# Patient Record
Sex: Female | Born: 1995 | Race: White | Hispanic: No | Marital: Single | Smoking: Never smoker
Health system: Southern US, Community
[De-identification: ages and names within clinical notes are randomized; demographics above are authoritative.]

---
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
## 2017-01-08 DIAGNOSIS — Z23 Encounter for immunization: Secondary | ICD-10-CM | POA: Diagnosis not present

## 2017-08-13 ENCOUNTER — Ambulatory Visit: Payer: Self-pay | Admitting: Nurse Practitioner

## 2017-08-19 ENCOUNTER — Encounter: Payer: Self-pay | Admitting: Nurse Practitioner

## 2017-08-19 ENCOUNTER — Ambulatory Visit (INDEPENDENT_AMBULATORY_CARE_PROVIDER_SITE_OTHER): Payer: BLUE CROSS/BLUE SHIELD | Admitting: Nurse Practitioner

## 2017-08-19 VITALS — BP 100/60 | HR 70 | Temp 98.3°F | Ht 63.0 in | Wt 123.0 lb

## 2017-08-19 DIAGNOSIS — Z136 Encounter for screening for cardiovascular disorders: Secondary | ICD-10-CM | POA: Diagnosis not present

## 2017-08-19 DIAGNOSIS — Z0000 Encounter for general adult medical examination without abnormal findings: Secondary | ICD-10-CM | POA: Diagnosis not present

## 2017-08-19 DIAGNOSIS — Z13220 Encounter for screening for lipoid disorders: Secondary | ICD-10-CM

## 2017-08-19 LAB — COMPREHENSIVE METABOLIC PANEL
ALT: 8 U/L (ref 0–35)
AST: 16 U/L (ref 0–37)
Albumin: 4.3 g/dL (ref 3.5–5.2)
Alkaline Phosphatase: 61 U/L (ref 39–117)
BUN: 8 mg/dL (ref 6–23)
CO2: 23 meq/L (ref 19–32)
Calcium: 9 mg/dL (ref 8.4–10.5)
Chloride: 105 mEq/L (ref 96–112)
Creatinine, Ser: 0.58 mg/dL (ref 0.40–1.20)
GFR: 139.64 mL/min (ref >60.00)
GLUCOSE: 85 mg/dL (ref 70–99)
Potassium: 3.8 mEq/L (ref 3.5–5.1)
Sodium: 136 mEq/L (ref 135–145)
Total Bilirubin: 0.6 mg/dL (ref 0.2–1.2)
Total Protein: 7.7 g/dL (ref 6.0–8.3)

## 2017-08-19 LAB — CBC
HCT: 33.2 % — ABNORMAL LOW (ref 36.0–46.0)
HEMOGLOBIN: 10.6 g/dL — AB (ref 12.0–15.0)
MCHC: 31.8 g/dL (ref 30.0–36.0)
MCV: 75.1 fl — ABNORMAL LOW (ref 78.0–100.0)
Platelets: 305 10*3/uL (ref 150.0–400.0)
RBC: 4.43 Mil/uL (ref 3.87–5.11)
RDW: 18.9 % — ABNORMAL HIGH (ref 11.5–14.6)
WBC: 5.6 10*3/uL (ref 4.5–10.5)

## 2017-08-19 LAB — LIPID PANEL
CHOL/HDL RATIO: 2
Cholesterol: 136 mg/dL (ref 0–200)
HDL: 58.2 mg/dL (ref >39.00)
LDL Cholesterol: 69 mg/dL (ref 0–99)
NONHDL: 77.87
Triglycerides: 42 mg/dL (ref 0.0–149.0)
VLDL: 8.4 mg/dL (ref 0.0–40.0)

## 2017-08-19 LAB — TSH: TSH: 2.61 u[IU]/mL (ref 0.35–5.50)

## 2017-08-19 NOTE — Progress Notes (Signed)
Subjective:    Patient ID: Allison Taylor, female    DOB: 10-20-1995, 22 y.o.   MRN: [id number]  Patient presents today for complete physical  HPI Also needs school health form completed. 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  Denies any acute complain.  Immunizations: (TDAP, Hep C screen, Pneumovax, Influenza, zoster)  Health Maintenance  Topic Date Due  . HIV Screening  08/20/2018*  . Flu Shot  09/09/2017  . Tetanus Vaccine  09/08/2021  *Topic was postponed. The date shown is not the original due date.   Diet:regular.  Weight:  Wt Readings from Last 3 Encounters:  08/19/17 123 lb (55.8 kg)  06/29/14 116 lb (52.6 kg) (34 %, Z= -0.40)*   * Growth percentiles are based on CDC (Girls, 2-20 Years) data.   Exercise:none.  Fall Risk: Fall Risk  08/19/2017  Falls in the past year? No   Home Safety:home with siblings and parents.  Depression/Suicide: Depression screen PHQ 2/9 08/19/2017  Decreased Interest 0  Down, Depressed, Hopeless 0  PHQ - 2 Score 0   Vision:  Visual Acuity Screening   Right eye Left eye Both eyes  Without correction:     With correction: 20/20 20/20 20/15    Dental:every 6months, braces inserted, up to date.  Medications and allergies reviewed with patient and updated if appropriate.  There are no active problems to display for this patient.   No current outpatient medications on file prior to visit.   No current facility-administered medications on file prior to visit.     History reviewed. No pertinent past medical history.  History reviewed. No pertinent surgical history.  Social History   Socioeconomic History  . Marital status: Single    Spouse name: Not on file  . Number of children: Not on file  . Years of education: Not on file  . Highest education level: Not on file  Occupational History  . Occupation: Consulting civil engineer    Comment: [employer]  Social Needs  . Financial resource strain: Not on file  . Food insecurity:   Worry: Not on file    Inability: Not on file  . Transportation needs:    Medical: Not on file    Non-medical: Not on file  Tobacco Use  . Smoking status: Never Smoker  . Smokeless tobacco: Never Used  Substance and Sexual Activity  . Alcohol use: Yes    Comment: social  . Drug use: Never  . Sexual activity: Not Currently  Lifestyle  . Physical activity:    Days per week: Not on file    Minutes per session: Not on file  . Stress: Not on file  Relationships  . Social connections:    Talks on phone: Not on file    Gets together: Not on file    Attends religious service: Not on file    Active member of club or organization: Not on file    Attends meetings of clubs or organizations: Not on file    Relationship status: Not on file  Other Topics Concern  . Not on file  Social History Narrative  . Not on file    Family History  Problem Relation Age of Onset  . Cancer Maternal Grandmother   . Anemia Mother   . Hypotension Mother        Review of Systems  Constitutional: Negative for fever, malaise/fatigue and weight loss.  HENT: Negative for congestion and sore throat.   Eyes:       Negative for  visual changes  Respiratory: Negative for cough and shortness of breath.   Cardiovascular: Negative for chest pain, palpitations and leg swelling.  Gastrointestinal: Negative for blood in stool, constipation, diarrhea and heartburn.  Genitourinary: Negative for dysuria, frequency and urgency.  Musculoskeletal: Negative for falls, joint pain and myalgias.  Skin: Negative for rash.  Neurological: Negative for dizziness, sensory change and headaches.  Endo/Heme/Allergies: Does not bruise/bleed easily.  Psychiatric/Behavioral: Negative for depression, substance abuse and suicidal ideas. The patient is not nervous/anxious.     Objective:   Vitals:   08/19/17 1006  BP: 100/60  Pulse: 70  Temp: 98.3 F (36.8 C)  SpO2: 99%    Body mass index is 21.79 kg/m.   Physical  Examination:  Physical Exam  Constitutional: She is oriented to person, place, and time. She appears well-developed. No distress.  HENT:  Right Ear: External ear normal.  Left Ear: External ear normal.  Nose: Nose normal.  Mouth/Throat: Oropharynx is clear and moist. No oropharyngeal exudate.  Eyes: Pupils are equal, round, and reactive to light. Conjunctivae and EOM are normal.  Neck: Normal range of motion. Neck supple. No thyromegaly present.  Cardiovascular: Normal rate, regular rhythm and normal heart sounds.  Pulmonary/Chest: Effort normal and breath sounds normal. No respiratory distress. She exhibits no tenderness.  Declined breast exam  Abdominal: Soft. Bowel sounds are normal. She exhibits no distension. There is no tenderness.  Genitourinary:  Genitourinary Comments: Deferred to next year.  Musculoskeletal: Normal range of motion. She exhibits no edema, tenderness or deformity.  Lymphadenopathy:    She has no cervical adenopathy.  Neurological: She is alert and oriented to person, place, and time. She has normal reflexes.  Skin: Skin is warm and dry. Capillary refill takes less than 2 seconds. No rash noted.  Psychiatric: She has a normal mood and affect. Her behavior is normal. Thought content normal.  Vitals reviewed.   ASSESSMENT and PLAN:  Valora CorporalJueun was seen today for establish care.  Diagnoses and all orders for this visit:  Preventative health care -     CBC -     TSH -     Lipid panel -     Comprehensive metabolic panel  Encounter for lipid screening for cardiovascular disease -     Lipid panel   No problem-specific Assessment & Plan notes found for this encounter.     Follow up: Return if symptoms worsen or fail to improve.  Alysia Pennaharlotte Tanairy Payeur, NP

## 2017-08-19 NOTE — Patient Instructions (Addendum)
Normal lipid panel, CMP and TSH. CBC indicates mild anemia, possibly due to iron deficiency. Start multivitamin with iron 1tab once a day. Also maintain balanced diet and adequate oral hydration  Due to low blood pressure, maintain adequate oral hydration at all times.  Hypotension As your heart beats, it forces blood through your body. This force is called blood pressure. If you have hypotension, you have low blood pressure. When your blood pressure is too low, you may not get enough blood to your brain. You may feel weak, feel light-headed, have a fast heartbeat, or even pass out (faint). Follow these instructions at home: Eating and drinking  Drink enough fluids to keep your pee (urine) clear or pale yellow.  Eat a healthy diet, and follow instructions from your doctor about eating or drinking restrictions. A healthy diet includes: ? Fresh fruits and vegetables. ? Whole grains. ? Low-fat (lean) meats. ? Low-fat dairy products.  Eat extra salt only as told. Do not add extra salt to your diet unless your doctor tells you to.  Eat small meals often.  Avoid standing up quickly after you eat. Medicines  Take over-the-counter and prescription medicines only as told by your doctor. ? Follow instructions from your doctor about changing how much you take (the dosage) of your medicines, if this applies. ? Do not stop or change your medicine on your own. General instructions  Wear compression stockings as told by your doctor.  Get up slowly from lying down or sitting.  Avoid hot showers and a lot of heat as told by your doctor.  Return to your normal activities as told by your doctor. Ask what activities are safe for you.  Do not use any products that contain nicotine or tobacco, such as cigarettes and e-cigarettes. If you need help quitting, ask your doctor.  Keep all follow-up visits as told by your doctor. This is important. Contact a doctor if:  You throw up (vomit).  You  have watery poop (diarrhea).  You have a fever for more than 2-3 days.  You feel more thirsty than normal.  You feel weak and tired. Get help right away if:  You have chest pain.  You have a fast or irregular heartbeat.  You lose feeling (get numbness) in any part of your body.  You cannot move your arms or your legs.  You have trouble talking.  You get sweaty or feel light-headed.  You faint.  You have trouble breathing.  You have trouble staying awake.  You feel confused. This information is not intended to replace advice given to you by your health care provider. Make sure you discuss any questions you have with your health care provider. Document Released: 04/22/2009 Document Revised: 10/15/2015 Document Reviewed: 10/15/2015 Elsevier Interactive Patient Education  2017 [place] Breast self-awareness means:  Knowing how your breasts look.  Knowing how your breasts feel.  Checking your breasts every month for changes.  Telling your doctor if you notice a change in your breasts.  Breast self-awareness allows you to notice a breast problem early while it is still small. How to do a breast self-exam One way to learn what is normal for your breasts and to check for changes is to do a breast self-exam. To do a breast self-exam: Look for Changes  1. Take off all the clothes above your waist. 2. Stand in front of a mirror in a room with good lighting. 3. Put your hands on your hips.  4. Push your hands down. 5. Look at your breasts and nipples in the mirror to see if one breast or nipple looks different than the other. Check to see if: ? The shape of one breast is different. ? The size of one breast is different. ? There are wrinkles, dips, and bumps in one breast and not the other. 6. Look at each breast for changes in your skin, such as: ? Redness. ? Scaly areas. 7. Look for changes in your nipples, such as: ? Liquid around the  nipples. ? Bleeding. ? Dimpling. ? Redness. ? A change in where the nipples are. Feel for Changes 1. Lie on your back on the floor. 2. Feel each breast. To do this, follow these steps: ? Pick a breast to feel. ? Put the arm closest to that breast above your head. ? Use your other arm to feel the nipple area of your breast. Feel the area with the pads of your three middle fingers by making small circles with your fingers. For the first circle, press lightly. For the second circle, press harder. For the third circle, press even harder. ? Keep making circles with your fingers at the light, harder, and even harder pressures as you move down your breast. Stop when you feel your ribs. ? Move your fingers a little toward the center of your body. ? Start making circles with your fingers again, this time going up until you reach your collarbone. ? Keep making up and down circles until you reach your armpit. Remember to keep using the three pressures. ? Feel the other breast in the same way. 3. Sit or stand in the shower or tub. 4. With soapy water on your skin, feel each breast the same way you did in step 2, when you were lying on the floor. Write Down What You Find  After doing the self-exam, write down:  What is normal for each breast.  Any changes you find in each breast.  When you last had your period.  How often should I check my breasts? Check your breasts every month. If you are breastfeeding, the best time to check them is after you feed your baby or after you use a breast pump. If you get periods, the best time to check your breasts is 5-7 days after your period is over. When should I see my doctor? See your doctor if you notice:  A change in shape or size of your breasts or nipples.  A change in the skin of your breast or nipples, such as red or scaly skin.  Unusual fluid coming from your nipples.  A lump or thick area that was not there before.  Pain in your  breasts.  Anything that concerns you.  This information is not intended to replace advice given to you by your health care provider. Make sure you discuss any questions you have with your health care provider. Document Released: 07/15/2007 Document Revised: 07/04/2015 Document Reviewed: 12/16/2014 Elsevier Interactive Patient Education  2018 [place] for [place], Female The transition to life after high school as a young adult can be a stressful time with many changes. You may start seeing a primary care physician instead of a pediatrician. This is the time when your health care becomes your responsibility. Preventive care refers to lifestyle choices and visits with your health care provider that can promote health and wellness. What does preventive care include?  A yearly physical exam. This is  also called an annual wellness visit.  Dental exams once or twice a year.  Routine eye exams. Ask your health care provider how often you should have your eyes checked.  Personal lifestyle choices, including: ? Daily care of your teeth and gums. ? Regular physical activity. ? Eating a healthy diet. ? Avoiding tobacco and drug use. ? Avoiding or limiting alcohol use. ? Practicing safe sex. ? Taking vitamin and mineral supplements as recommended by your health care provider. What happens during an annual wellness visit? Preventive care starts with a yearly visit to your primary care physician. The services and screenings done by your health care provider during your annual wellness visit will depend on your overall health, lifestyle risk factors, and family history of disease. Counseling Your health care provider may ask you questions about:  Past medical problems and your family's medical history.  Medicines or supplements you take.  Health insurance and access to health care.  Alcohol, tobacco, and drug use.  Your safety at home, work, or school.  Access  to firearms.  Emotional well-being and how you cope with stress.  Relationship well-being.  Diet, exercise, and sleep habits.  Your sexual health and activity.  Your methods of birth control.  Your menstrual cycle.  Your pregnancy history.  Screening You may have the following tests or measurements:  Height, weight, and BMI.  Blood pressure.  Lipid and cholesterol levels.  Tuberculosis skin test.  Skin exam.  Vision and hearing tests.  Screening test for hepatitis.  Screening tests for sexually transmitted diseases (STDs), if you are at risk.  BRCA-related cancer screening. This may be done if you have a family history of breast, ovarian, tubal, or peritoneal cancers.  Pelvic exam and Pap test. This may be done every 3 years starting at age 46.  Vaccines Your health care provider may recommend certain vaccines, such as:  Influenza vaccine. This is recommended every year.  Tetanus, diphtheria, and acellular pertussis (Tdap, Td) vaccine. You may need a Td booster every 10 years.  Varicella vaccine. You may need this if you have not been vaccinated.  HPV vaccine. If you are 81 or younger, you may need three doses over 6 months.  Measles, mumps, and rubella (MMR) vaccine. You may need at least one dose of MMR. You may also need a second dose.  Pneumococcal 13-valent conjugate (PCV13) vaccine. You may need this if you have certain conditions and were not previously vaccinated.  Pneumococcal polysaccharide (PPSV23) vaccine. You may need one or two doses if you smoke cigarettes or if you have certain conditions.  Meningococcal vaccine. One dose is recommended if you are age 41-21 years and a first-year college student living in a residence hall, or if you have one of several medical conditions. You may also need additional booster doses.  Hepatitis A vaccine. You may need this if you have certain conditions or if you travel or work in places where you may be exposed  to hepatitis A.  Hepatitis B vaccine. You may need this if you have certain conditions or if you travel or work in places where you may be exposed to hepatitis B.  Haemophilus influenzae type b (Hib) vaccine. You may need this if you have certain risk factors.  Talk to your health care provider about which screenings and vaccines you need and how often you need them. What steps can I take to develop healthy behaviors?  Have regular preventive health care visits with your primary care physician  and dentist.  Eat a healthy diet.  Drink enough fluid to keep your urine clear or pale yellow.  Stay active. Exercise at least 30 minutes 5 or more days of the week.  Use alcohol responsibly.  Maintain a healthy weight.  Do not use any products that contain nicotine, such as cigarettes, chewing tobacco, and e-cigarettes. If you need help quitting, ask your health care provider.  Do not use drugs.  Practice safe sex.  Use birth control (contraception) to prevent unwanted pregnancy. If you plan to become pregnant, see your health care provider for a pre-conception visit.  Find healthy ways to manage stress. How can I protect myself from injury? Injuries from violence or accidents are the leading cause of death among young adults and can often be prevented. Take these steps to help protect yourself:  Always wear your seat belt while driving or riding in a vehicle.  Do not drive if you have been drinking alcohol. Do not ride with someone who has been drinking.  Do not drive when you are tired or distracted. Do not text while driving.  Wear a helmet and other protective equipment during sports activities.  If you have firearms in your house, make sure you follow all gun safety procedures.  Seek help if you have been bullied, physically abused, or sexually abused.  Use the Internet responsibly to avoid dangers such as online bullying and online sexual predators.  What can I do to cope  with stress? Young adults may face many new challenges that can be stressful, such as finding a job, going to college, moving away from home, managing money, being in a relationship, getting married, and having children. To manage stress:  Avoid known stressful situations when you can.  Exercise regularly.  Find a stress-reducing activity that works best for you. Examples include meditation, yoga, listening to music, or reading.  Spend time in nature.  Keep a journal to write about your stress and how you respond.  Talk to your health care provider about stress. He or she may suggest counseling.  Spend time with supportive friends or family.  Do not cope with stress by: ? Drinking alcohol or using drugs. ? Smoking cigarettes. ? Eating.  Where can I get more information? Learn more about preventive care and healthy habits from:  [place] and Gynecologists: KaraokeExchange.nl  U.S. Probation officer Task Force: StageSync.si  National Adolescent and [employer]: StrategicRoad.nl  American Academy of Pediatrics Bright Futures: https://brightfutures.MemberVerification.co.za  Society for Adolescent Health and Medicine: MoralBlog.co.za.aspx  PodExchange.nl: ToyLending.fr  This information is not intended to replace advice given to you by your health care provider. Make sure you discuss any questions you have with your health care provider. Document Released: 06/13/2015 Document Revised: 07/04/2015 Document Reviewed: 06/13/2015 Elsevier Interactive Patient Education  [employer].

## 2017-08-27 ENCOUNTER — Telehealth: Payer: Self-pay | Admitting: Nurse Practitioner

## 2017-08-27 DIAGNOSIS — Z111 Encounter for screening for respiratory tuberculosis: Secondary | ICD-10-CM

## 2017-08-27 DIAGNOSIS — Z1159 Encounter for screening for other viral diseases: Secondary | ICD-10-CM

## 2017-08-27 NOTE — Telephone Encounter (Signed)
Pt is aware of lab order and she will come in next week.

## 2017-08-27 NOTE — Telephone Encounter (Signed)
Left vm for the pt to call back.  

## 2017-08-27 NOTE — Telephone Encounter (Signed)
Copied from CRM [id number]. Topic: Quick Communication - See Telephone Encounter >> Aug 27, 2017  3:18 PM Burchel, Abbi R wrote: CRM for notification. See Telephone encounter for: 08/27/17.  Pt states she would like a call back to discuss some questions she has about her labs from [facility] on 08/19/17.   Pt: [phone]

## 2017-09-09 ENCOUNTER — Other Ambulatory Visit: Payer: BLUE CROSS/BLUE SHIELD

## 2017-09-09 ENCOUNTER — Other Ambulatory Visit (INDEPENDENT_AMBULATORY_CARE_PROVIDER_SITE_OTHER): Payer: BLUE CROSS/BLUE SHIELD

## 2017-09-09 DIAGNOSIS — Z111 Encounter for screening for respiratory tuberculosis: Secondary | ICD-10-CM

## 2017-09-09 DIAGNOSIS — Z1159 Encounter for screening for other viral diseases: Secondary | ICD-10-CM | POA: Diagnosis not present

## 2017-09-16 ENCOUNTER — Encounter: Payer: Self-pay | Admitting: Nurse Practitioner

## 2017-09-17 LAB — TEST AUTHORIZATION

## 2017-09-17 LAB — HEPATITIS B CORE ANTIBODY, TOTAL: Hep B Core Total Ab: NONREACTIVE

## 2017-09-17 LAB — QUANTIFERON-TB GOLD PLUS
NIL: 0.87 IU/mL
QuantiFERON-TB Gold Plus: NEGATIVE
TB2-NIL: <0 IU/mL

## 2017-09-20 ENCOUNTER — Encounter: Payer: Self-pay | Admitting: Nurse Practitioner

## 2018-10-21 DIAGNOSIS — Z0000 Encounter for general adult medical examination without abnormal findings: Secondary | ICD-10-CM | POA: Diagnosis not present

## 2018-10-21 DIAGNOSIS — Z7689 Persons encountering health services in other specified circumstances: Secondary | ICD-10-CM | POA: Diagnosis not present

## 2018-10-21 DIAGNOSIS — R87610 Atypical squamous cells of undetermined significance on cytologic smear of cervix (ASC-US): Secondary | ICD-10-CM | POA: Diagnosis not present

## 2018-10-21 DIAGNOSIS — Z23 Encounter for immunization: Secondary | ICD-10-CM | POA: Diagnosis not present

## 2018-10-21 DIAGNOSIS — Z01411 Encounter for gynecological examination (general) (routine) with abnormal findings: Secondary | ICD-10-CM | POA: Diagnosis not present

## 2018-10-21 DIAGNOSIS — Z113 Encounter for screening for infections with a predominantly sexual mode of transmission: Secondary | ICD-10-CM | POA: Diagnosis not present

## 2018-11-18 DIAGNOSIS — A749 Chlamydial infection, unspecified: Secondary | ICD-10-CM | POA: Diagnosis not present

## 2018-12-16 DIAGNOSIS — Z23 Encounter for immunization: Secondary | ICD-10-CM | POA: Diagnosis not present

## 2018-12-16 DIAGNOSIS — D649 Anemia, unspecified: Secondary | ICD-10-CM | POA: Diagnosis not present

## 2019-12-01 ENCOUNTER — Other Ambulatory Visit: Payer: Self-pay | Admitting: Specialist

## 2019-12-01 DIAGNOSIS — H9311 Tinnitus, right ear: Secondary | ICD-10-CM

## 2019-12-29 ENCOUNTER — Inpatient Hospital Stay: Admission: RE | Admit: 2019-12-29 | Payer: BLUE CROSS/BLUE SHIELD | Source: Ambulatory Visit

## 2020-02-23 ENCOUNTER — Other Ambulatory Visit: Payer: Self-pay

## 2020-03-08 ENCOUNTER — Ambulatory Visit
Admission: RE | Admit: 2020-03-08 | Discharge: 2020-03-08 | Disposition: A | Discharge status: Home / Self Care | Payer: BC Managed Care – PPO | Source: Ambulatory Visit | Attending: Specialist | Admitting: Specialist

## 2020-03-08 ENCOUNTER — Other Ambulatory Visit: Payer: Self-pay

## 2020-03-08 DIAGNOSIS — H9311 Tinnitus, right ear: Secondary | ICD-10-CM

## 2020-03-08 MED ORDER — GADOBENATE DIMEGLUMINE 529 MG/ML IV SOLN
10.0000 mL | Freq: Once | INTRAVENOUS | Status: AC | PRN
  Administered 2020-03-08: 10 mL via INTRAVENOUS

## 2021-11-04 IMAGING — MR MR BRAIN/IAC WO/W CM
11 of 12 series · 39 of 48 positions shown · IV contrast (11ml Multihance)
Comparison: None.

CLINICAL DATA: Tinnitus of right ear.

EXAM:
MRI HEAD WITHOUT AND WITH CONTRAST
TECHNIQUE: Multiplanar, multiecho pulse sequences of the brain and surrounding
structures were obtained without and with intravenous contrast.
CONTRAST:  10mL MULTIHANCE GADOBENATE DIMEGLUMINE 529 MG/ML IV SOLN

[Series 2: T1 · sagittal · 5.0mm · 0.45mm/px · 2 of 21 slices shown (1 of 3)]
[im 1/21]
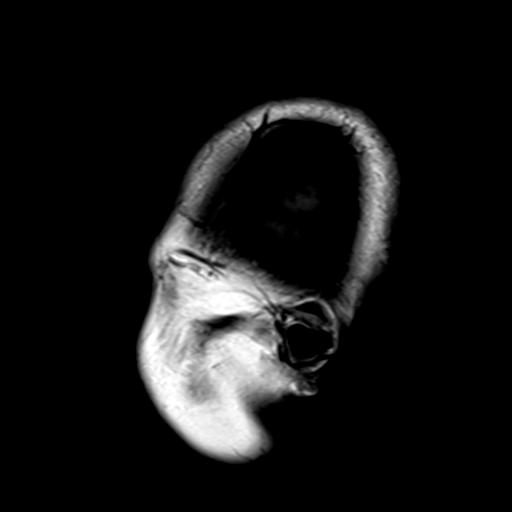
[im 21/21]
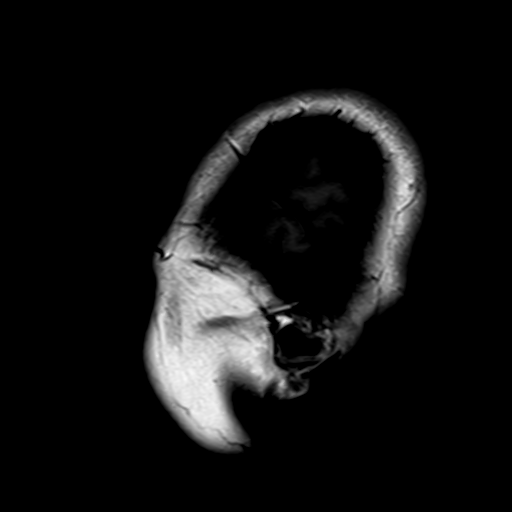

[Series 3: T2 · axial · 5.0mm · 0.72mm/px · z∈[-26,+116]mm · 2 of 23 slices shown]
[im 1/23]
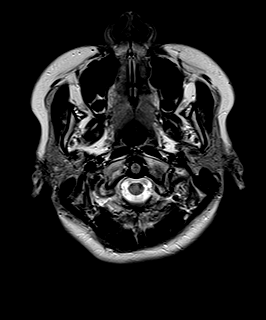
[im 23/23]
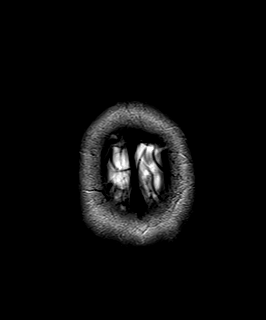

[Series 4: DWI · axial · 3.0mm · 1.80mm/px · z∈[-25,+120]mm · 11 of 100 slices shown (1 of 2)]
[im 1/100]
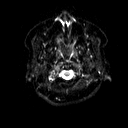
[im 10/100]
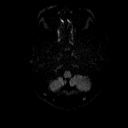
[im 20/100]
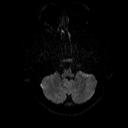
[im 30/100]
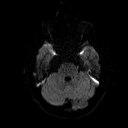
[im 40/100]
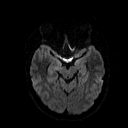
[im 50/100]
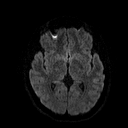
[im 60/100]
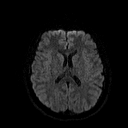
[im 70/100]
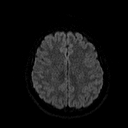
[im 80/100]
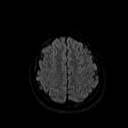
[im 90/100]
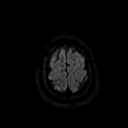
[im 100/100]
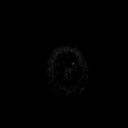

[Series 5: DWI · axial · 3.0mm · 1.80mm/px · z∈[-25,+120]mm · 5 of 50 slices shown (2 of 2)]
[im 1/50]
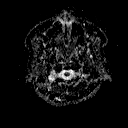
[im 13/50]
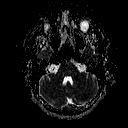
[im 25/50]
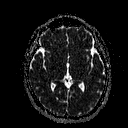
[im 37/50]
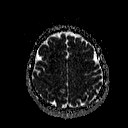
[im 50/50]
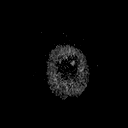

[Series 6: FLAIR · axial · 3.0mm · 0.45mm/px · z∈[-33,+122]mm · 3 of 27 slices shown]
[im 1/27]
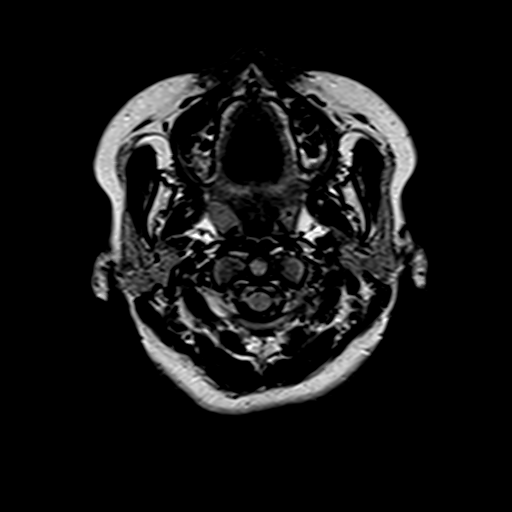
[im 14/27]
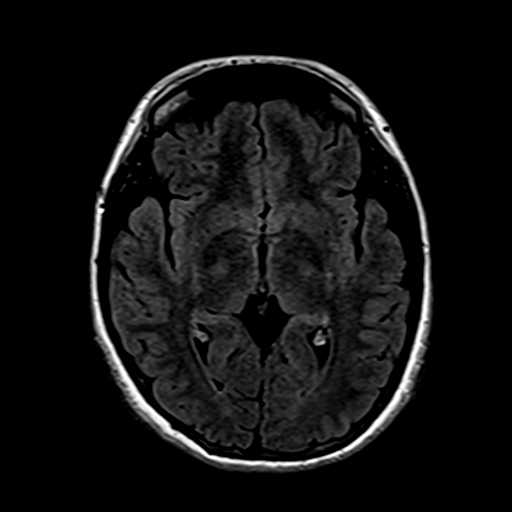
[im 27/27]
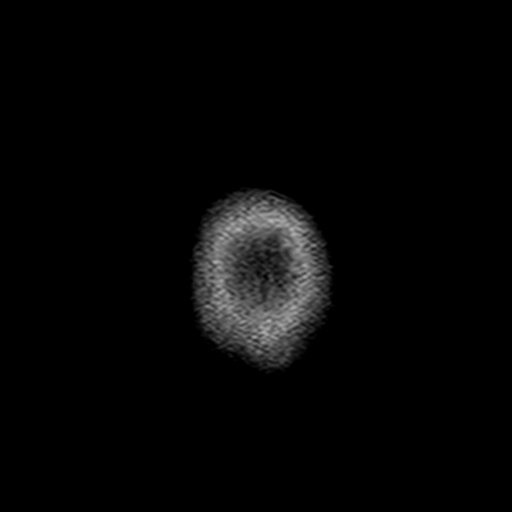

[Series 8: swi_images · axial · 3.0mm · 0.90mm/px · z∈[-25,+115]mm · 5 of 48 slices shown]
[im 1/48]
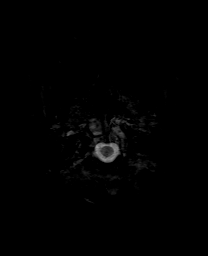
[im 12/48]
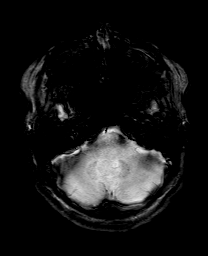
[im 24/48]
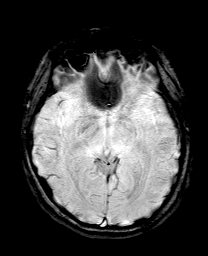
[im 36/48]
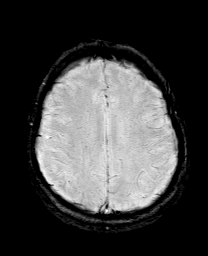
[im 48/48]
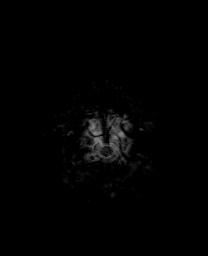

[Series 9: T1 · coronal · 3.0mm · 0.35mm/px · 1 of 11 slices shown (2 of 3)]
[im 1/11]
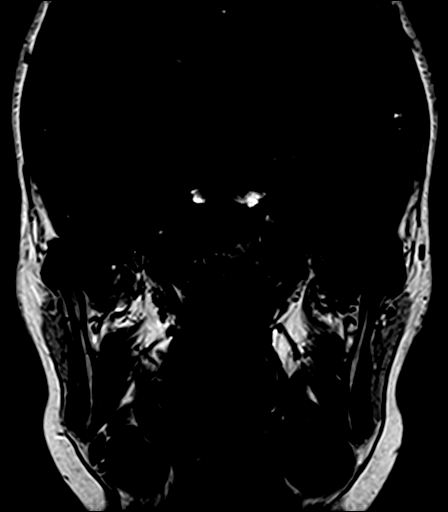

[Series 10: T1 · axial · 3.0mm · 0.35mm/px · z∈[-6,+40]mm · 2 of 15 slices shown (3 of 3)]
[im 1/15]
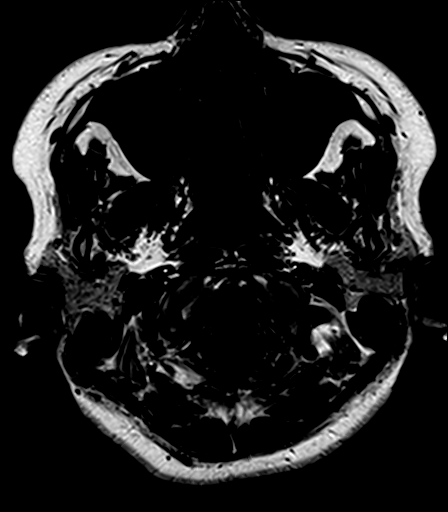
[im 15/15]
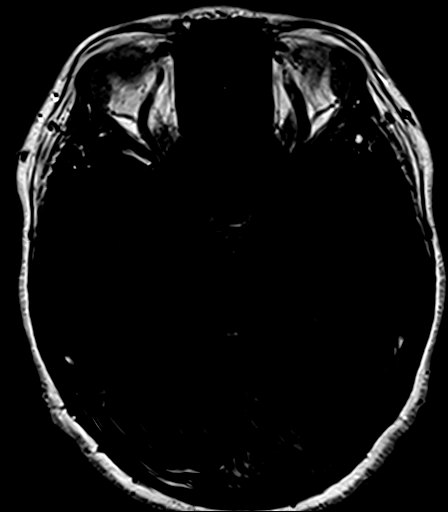

[Series 11: bSSFP · axial · 1.0mm · 0.28mm/px · z∈[-10,+34]mm · 5 of 56 slices shown]
[im 1/56]
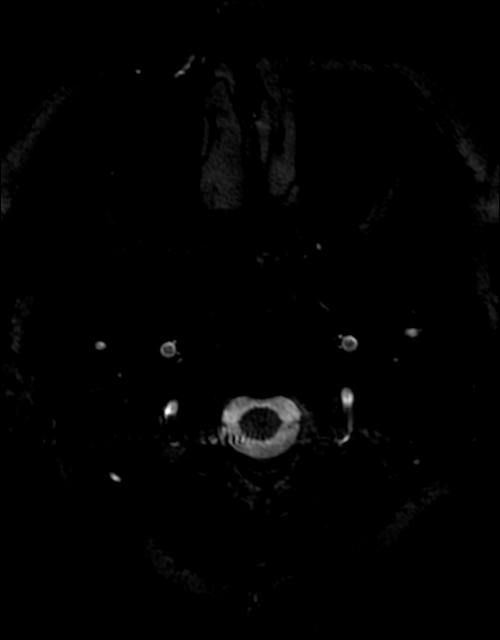
[im 12/56]
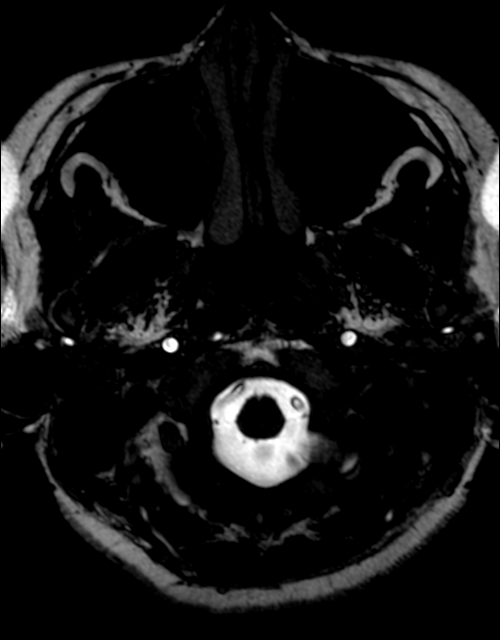
[im 23/56]
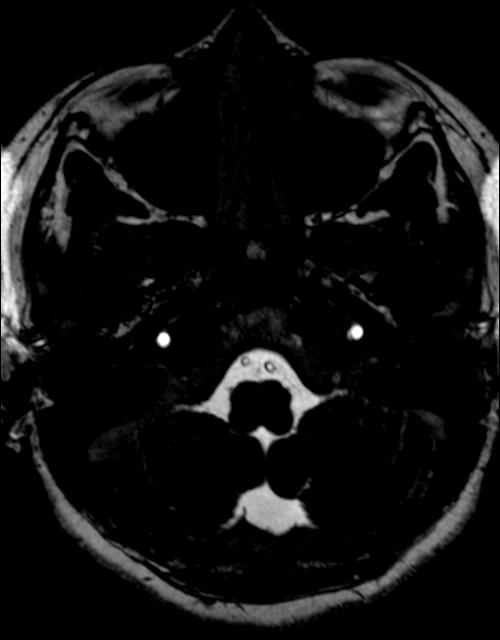
[im 34/56]
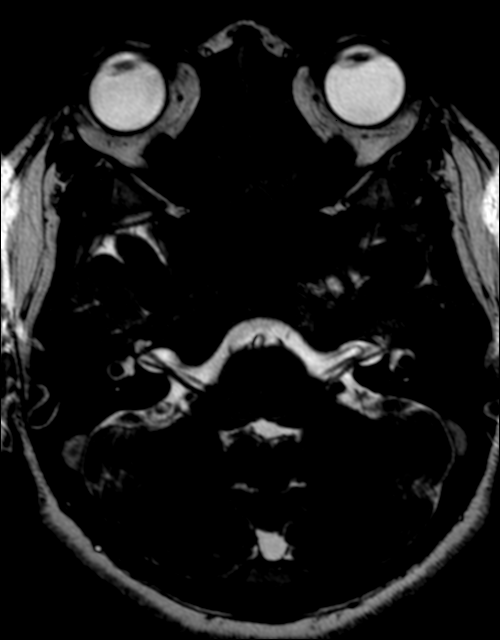
[im 45/56]
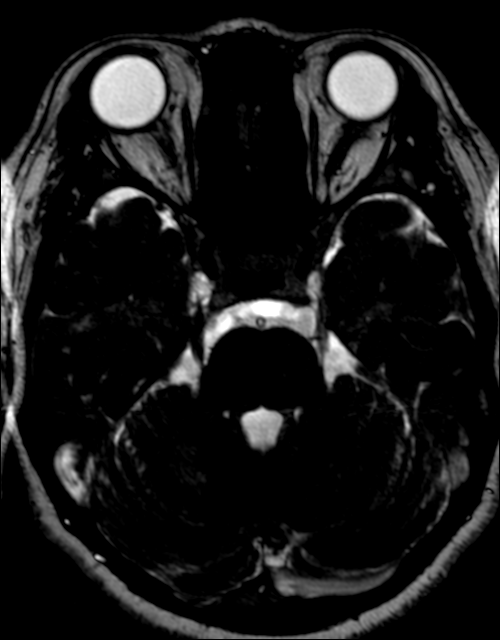

[Series 12: T1 post-contrast · coronal · 3.0mm · 0.35mm/px · 1 of 11 slices shown (1 of 2)]
[im 1/11]
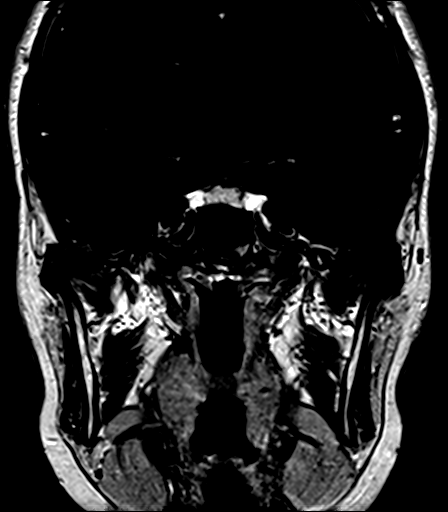

[Series 13: T1 post-contrast · axial · 3.0mm · 0.35mm/px · z∈[-6,+40]mm · 2 of 15 slices shown (2 of 2)]
[im 1/15]
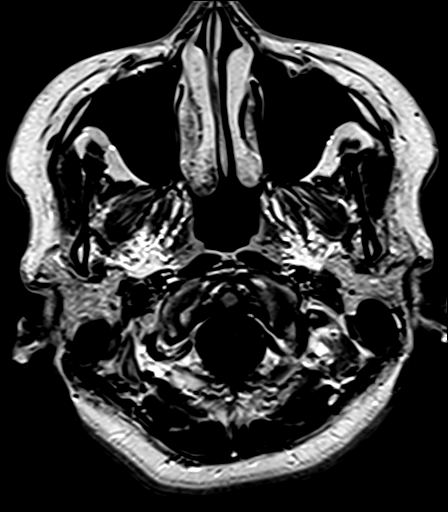
[im 15/15]
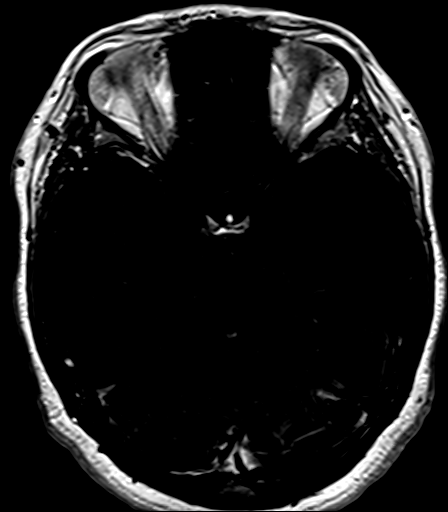

[39 of 48 positions shown; findings below may reference images not displayed]

FINDINGS: Brain: No acute infarction, hemorrhage, hydrocephalus, extra-axial
collection or mass lesion.

No cerebellopontine angle mass or internal auditory canal lesion is
demonstrated.Normal appearance of the 7th and 8th cranial nerves
bilaterally. No focus of abnormal contrast enhancement.

Vascular: Normal flow voids.

Skull and upper cervical spine: Normal marrow signal.

Sinuses/Orbits: Negative.
IMPRESSION: Normal MRI of the brain.
